# Patient Record
Sex: Female | Born: 2000 | Race: White | Hispanic: No | Marital: Single | Smoking: Never smoker
Health system: Southern US, Community
[De-identification: ages and names within clinical notes are randomized; demographics above are authoritative.]

## PROBLEM LIST (undated history)

## (undated) DIAGNOSIS — F419 Anxiety disorder, unspecified: Secondary | ICD-10-CM

## (undated) HISTORY — DX: Anxiety disorder, unspecified: F41.9

---
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
## 2022-06-24 ENCOUNTER — Ambulatory Visit
Admission: EM | Admit: 2022-06-24 | Discharge: 2022-06-24 | Disposition: A | Discharge status: Home / Self Care | Payer: BC Managed Care – PPO | Attending: Physician Assistant | Admitting: Physician Assistant

## 2022-06-24 DIAGNOSIS — J329 Chronic sinusitis, unspecified: Secondary | ICD-10-CM | POA: Diagnosis not present

## 2022-06-24 DIAGNOSIS — J40 Bronchitis, not specified as acute or chronic: Secondary | ICD-10-CM | POA: Diagnosis not present

## 2022-06-24 MED ORDER — AMOXICILLIN-POT CLAVULANATE 875-125 MG PO TABS: 1.0000 | ORAL_TABLET | Freq: Two times a day (BID) | ORAL | 0 refills | Status: DC
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
## 2022-06-24 NOTE — ED Triage Notes (Signed)
Pt states cough,congestion,ear pain and headaches for over a week.  Seen at urgent care last week had a negative Covid test. Was prescribed steroids and an inhaler with no relief.

## 2023-02-03 ENCOUNTER — Ambulatory Visit (INDEPENDENT_AMBULATORY_CARE_PROVIDER_SITE_OTHER): Payer: BC Managed Care – PPO

## 2023-02-03 ENCOUNTER — Ambulatory Visit
Admission: EM | Admit: 2023-02-03 | Discharge: 2023-02-03 | Disposition: A | Discharge status: Home / Self Care | Payer: BC Managed Care – PPO | Attending: Emergency Medicine | Admitting: Emergency Medicine

## 2023-02-03 ENCOUNTER — Encounter: Payer: Self-pay | Admitting: Emergency Medicine

## 2023-02-03 DIAGNOSIS — R058 Other specified cough: Secondary | ICD-10-CM

## 2023-02-03 DIAGNOSIS — J22 Unspecified acute lower respiratory infection: Secondary | ICD-10-CM | POA: Diagnosis not present

## 2023-02-03 DIAGNOSIS — R509 Fever, unspecified: Secondary | ICD-10-CM | POA: Diagnosis not present

## 2023-02-03 LAB — POCT INFLUENZA A/B
Influenza A, POC: NEGATIVE
Influenza B, POC: NEGATIVE

## 2023-02-03 MED ORDER — AZITHROMYCIN 250 MG PO TABS: ORAL_TABLET | ORAL | 0 refills | Status: AC

## 2023-02-03 MED ORDER — AMOXICILLIN-POT CLAVULANATE 875-125 MG PO TABS: 1.0000 | ORAL_TABLET | Freq: Two times a day (BID) | ORAL | 0 refills | Status: AC

## 2023-02-03 MED ORDER — IBUPROFEN 400 MG PO TABS: 400.0000 mg | ORAL_TABLET | Freq: Three times a day (TID) | ORAL | 0 refills | Status: DC | PRN

## 2023-02-03 MED ORDER — PROMETHAZINE-DM 6.25-15 MG/5ML PO SYRP: 5.0000 mL | ORAL_SOLUTION | Freq: Every evening | ORAL | 0 refills | Status: DC | PRN

## 2023-02-03 MED ORDER — GUAIFENESIN 400 MG PO TABS: ORAL_TABLET | ORAL | 0 refills | Status: DC
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
## 2023-02-03 NOTE — Discharge Instructions (Addendum)
Your rapid influenza antigen test today was negative.  No further influenza testing is indicated.  Your chest x-ray is concerning for possible infection in your right lung.  I recommend that you begin antibiotics for treatment.  Please read below to learn more about the medications, dosages and frequencies that I recommend to help alleviate your symptoms and to get you feeling better soon:   Augmentin (amoxicillin - clavulanic acid):  Please take one (1) dose twice daily for 5 days.  This antibiotic can cause upset stomach, this will resolve once antibiotics are complete.  You are welcome to take a probiotic, eat yogurt, take Imodium while taking this medication.  Please avoid other systemic medications such as Maalox, Pepto-Bismol or milk of magnesia as they can interfere with the body's ability to absorb the antibiotics.    Z-Pak (azithromycin):  Please take two (2) tablets on day one and one tablet daily thereafter until the prescription is complete.This antibiotic can cause upset stomach, this will resolve once antibiotics are complete.  You are welcome to take a probiotic, eat yogurt, take Imodium while taking this medication.  Please avoid other systemic medications such as Maalox, Pepto-Bismol or milk of magnesia as they can interfere with the body's ability to absorb the antibiotics.       Advil, Motrin (ibuprofen): This is a good anti-inflammatory medication which addresses aches, pains and inflammation of the upper airways that causes sinus and nasal congestion as well as in the lower airways which makes your cough feel tight and sometimes burn.  I recommend that you take between 400 to 600 mg every 6-8 hours as needed.  Please do not take more than 2400 mg of ibuprofen in a 24-hour period and please do not take high doses of ibuprofen for more than 3 days in a row as this can lead to stomach ulcers.   Robitussin, Mucinex (guaifenesin): This is an expectorant.  This single symptom reliever  helps break up chest congestion and loosen up thick nasal drainage making phlegm and drainage easier to cough up and to blow out from your nose.  I recommend taking 400 mg in either liquid or tablet form three times daily as needed.  I do not recommend the 12-hour extended relief version or doses higher than 400 mg per each dose as these often make some patients feel jittery or jumpy and can interfere with sleep.  I also do not recommend that you purchase guaifenesin with the ingredient " DM" which is dextromethorphan, a cough suppressant and that you plan on taking it at bedtime.  Guaifenesin 400 mg is a safe dose for people who are being treated for high blood pressure.  This medication is available over-the-counter.   Promethazine DM: Promethazine is both a nasal decongestant that dries up mucous membranes and an antinausea medication.  Promethazine often makes most patients feel fairly sleepy.  "DM" is dextromethorphan, a single symptom reliever which is a cough suppressant found in many over-the-counter cough medications and combination cold preparations.  Please take 5 mL before bedtime to minimize your cough which will help you sleep better.  I have sent a prescription for this medication to your pharmacy because it cannot be purchased over-the-counter.   If symptoms have not meaningfully improved in the next 5 to 7 days, please return for repeat evaluation or follow-up with your regular provider.  If symptoms have worsened in the next 3 to 5 days, please return for repeat evaluation or follow-up with your regular provider.  Thank you for visiting urgent care today.  We appreciate the opportunity to participate in your care.

## 2023-02-03 NOTE — ED Triage Notes (Signed)
Pt reports chills, body aches, a cough and nasal congestion x 1 week. Has been taking Dayquil with no relief.

## 2024-07-30 ENCOUNTER — Ambulatory Visit
Admission: EM | Admit: 2024-07-30 | Discharge: 2024-07-30 | Disposition: A | Discharge status: Home / Self Care | Payer: Self-pay

## 2024-07-30 DIAGNOSIS — J020 Streptococcal pharyngitis: Secondary | ICD-10-CM

## 2024-07-30 LAB — POCT RAPID STREP A (OFFICE): Rapid Strep A Screen: POSITIVE — AB

## 2024-07-30 MED ORDER — AMOXICILLIN 500 MG PO CAPS: 500.0000 mg | ORAL_CAPSULE | Freq: Two times a day (BID) | ORAL | 0 refills | Status: AC

## 2024-07-30 NOTE — ED Provider Notes (Signed)
 [facility]  Note:  This document was prepared using Conservation officer, historic buildings and may include unintentional dictation errors.  MRN: [id number] DOB: 22-Nov-2000  Subjective:   Nathan Stallworth is a 23 y.o. female presenting for severe sore throat, body aches, fever, increased pain with swallowing.  Patient has been taking ibuprofen  dual action with Tylenol  and Mucinex  with minimal improvement to symptoms.  Patient denies any known sick contacts.  No exposure to COVID, flu, strep.  No shortness of breath, chest pain, weakness, dizziness.  No current facility-administered medications for this encounter.  Current Outpatient Medications:    amoxicillin  (AMOXIL ) 500 MG capsule, Take 1 capsule (500 mg total) by mouth 2 (two) times daily for 10 days., Disp: 20 capsule, Rfl: 0   busPIRone  (BUSPAR ) 7.5 MG tablet, Take 7.5 mg by mouth daily at 2 PM., Disp: , Rfl:    cloNIDine  (CATAPRES ) 0.1 MG tablet, Take 0.1 mg by mouth at bedtime., Disp: , Rfl:    FLUOXETINE HCL PO, Take by mouth., Disp: , Rfl:    guaifenesin  (HUMIBID E) 400 MG TABS tablet, Take 1 tablet 3 times daily as needed for chest congestion and cough, Disp: 30 tablet, Rfl: 0   hydrOXYzine (ATARAX) 10 MG tablet, Take 10-20 mg by mouth daily as needed., Disp: , Rfl:    Ibuprofen -Acetaminophen  (ADVIL  DUAL ACTION PO), Take by mouth., Disp: , Rfl:    methylphenidate (METADATE CD) 40 MG CR capsule, Take 40 mg by mouth every morning., Disp: , Rfl:    polyethylene glycol powder (GLYCOLAX /MIRALAX ) 17 GM/SCOOP powder, Take 17 g by mouth daily., Disp: , Rfl:    cholecalciferol  (VITAMIN D ) 25 MCG tablet, Take 2 tablets (2,000 Units total) by mouth daily., Disp: , Rfl:    fluvoxaMINE  (LUVOX ) 50 MG tablet, Take 50 mg by mouth at bedtime., Disp: , Rfl:    ibuprofen  (ADVIL ) 400 MG tablet, Take 1 tablet (400 mg total) by mouth every 8 (eight) hours as needed for up to 30 doses., Disp: 30 tablet, Rfl: 0   promethazine -dextromethorphan  (PROMETHAZINE -DM) 6.25-15 MG/5ML syrup, Take 5 mLs by mouth at bedtime as needed for cough., Disp: 60 mL, Rfl: 0   SRONYX 0.1-20 MG-MCG tablet, Take 1 tablet by mouth daily., Disp: , Rfl:    No Known Allergies  History reviewed. No pertinent past medical history.   Past Surgical History:  Procedure Laterality Date   OPEN REDUCTION INTERNAL FIXATION ACETABULAR FRACTURE STOPPA Left 04/02/2020   Procedure: OPEN REDUCTION INTERNAL FIXATION LEFT ACETABULAR FRACTURE STOPPA  APPROACH;  Surgeon: Kendal Franky SQUIBB, MD;  Location: [facility] OR;  Service: Orthopedics;  Laterality: Left;    History reviewed. No pertinent family history.  Social History   Tobacco Use   Smoking status: Never   Smokeless tobacco: Never  Vaping Use   Vaping status: Never Used  Substance Use Topics   Alcohol use: Yes   Drug use: Never    ROS Refer to HPI for ROS details.  Objective:   Vitals: BP 122/85 (BP Location: Left Arm)   Pulse (!) 113   Temp 100.2 F (37.9 C) (Oral)   Resp 18   Ht 5' 2 (1.575 m)   Wt 230 lb (104.3 kg)   LMP 07/08/2024 (Approximate)   SpO2 98%   BMI 42.07 kg/m   Physical Exam Vitals and nursing note reviewed.  Constitutional:      General: She is not in acute distress.    Appearance: She is well-developed. She is not ill-appearing or  toxic-appearing.  HENT:     Head: Normocephalic and atraumatic.     Nose: Congestion present. No rhinorrhea.     Mouth/Throat:     Mouth: Mucous membranes are moist.     Pharynx: Pharyngeal swelling, oropharyngeal exudate and posterior oropharyngeal erythema present. No uvula swelling.     Tonsils: 1+ on the right. 3+ on the left.  Cardiovascular:     Rate and Rhythm: Normal rate.  Pulmonary:     Effort: Pulmonary effort is normal. No respiratory distress.  Musculoskeletal:        General: Normal range of motion.  Skin:    General: Skin is warm and dry.  Neurological:     General: No focal deficit present.     Mental Status: She is alert  and oriented to person, place, and time.  Psychiatric:        Mood and Affect: Mood normal.        Behavior: Behavior normal.     Procedures  Results for orders placed or performed during the hospital encounter of 07/30/24 (from the past 24 hours)  POCT rapid strep A     Status: Abnormal   Collection Time: 07/30/24  7:51 PM  Result Value Ref Range   Rapid Strep A Screen Positive (A) Negative    No results found.   Assessment and Plan :     Discharge Instructions       1. Strep pharyngitis (Primary) - Ibuprofen -Acetaminophen  (ADVIL  DUAL ACTION PO); Take by mouth.  600 mg of ibuprofen  every 8 hours as needed for pain and inflammation to the throat secondary to strep pharyngitis. - POCT rapid strep A complete and UC is positive for strep pharyngitis - amoxicillin  (AMOXIL ) 500 MG capsule; Take 1 capsule (500 mg total) by mouth 2 (two) times daily for 10 days.  Dispense: 20 capsule; Refill: 0 -Continue to monitor symptoms for any change in severity if there is any escalation of current symptoms or development of new symptoms follow-up in ER for further evaluation and management.       Kihanna Kamiya B Claudia Alvizo   Laquana Villari, [place] B, NP 07/30/24 2010

## 2024-07-30 NOTE — Discharge Instructions (Addendum)
  1. Strep pharyngitis (Primary) - Ibuprofen -Acetaminophen  (ADVIL  DUAL ACTION PO); Take by mouth.  600 mg of ibuprofen  every 8 hours as needed for pain and inflammation to the throat secondary to strep pharyngitis. - POCT rapid strep A complete and UC is positive for strep pharyngitis - amoxicillin  (AMOXIL ) 500 MG capsule; Take 1 capsule (500 mg total) by mouth 2 (two) times daily for 10 days.  Dispense: 20 capsule; Refill: 0 -Continue to monitor symptoms for any change in severity if there is any escalation of current symptoms or development of new symptoms follow-up in ER for further evaluation and management.

## 2024-07-30 NOTE — ED Triage Notes (Signed)
 Patient reports onset of sore throat starting Monday (8 days ago), followed by body aches. Symptoms persisted until two days ago, accompanied by chills, cachexia, and possible fever. The sore throat is described as severely painful. Initially, patient took Mucinex , currently taking Ibuprofen  with last dose at approximately 12:30 PM today (two tablets of dual-action).

## 2024-09-25 ENCOUNTER — Emergency Department (HOSPITAL_BASED_OUTPATIENT_CLINIC_OR_DEPARTMENT_OTHER)
Admission: EM | Admit: 2024-09-25 | Discharge: 2024-09-25 | Disposition: A | Discharge status: Home / Self Care | Attending: Emergency Medicine | Admitting: Emergency Medicine

## 2024-09-25 ENCOUNTER — Other Ambulatory Visit: Payer: Self-pay

## 2024-09-25 DIAGNOSIS — O2341 Unspecified infection of urinary tract in pregnancy, first trimester: Secondary | ICD-10-CM | POA: Insufficient documentation

## 2024-09-25 DIAGNOSIS — Z3A00 Weeks of gestation of pregnancy not specified: Secondary | ICD-10-CM | POA: Diagnosis not present

## 2024-09-25 DIAGNOSIS — N390 Urinary tract infection, site not specified: Secondary | ICD-10-CM

## 2024-09-25 DIAGNOSIS — Z3490 Encounter for supervision of normal pregnancy, unspecified, unspecified trimester: Secondary | ICD-10-CM

## 2024-09-25 LAB — URINALYSIS, ROUTINE W REFLEX MICROSCOPIC
Bilirubin Urine: NEGATIVE
Glucose, UA: NEGATIVE mg/dL
Hgb urine dipstick: NEGATIVE
Ketones, ur: NEGATIVE mg/dL
Nitrite: NEGATIVE
Protein, ur: NEGATIVE mg/dL
Specific Gravity, Urine: 1.005 (ref 1.005–1.030)
pH: 6.5 (ref 5.0–8.0)

## 2024-09-25 LAB — HCG, QUANTITATIVE, PREGNANCY: hCG, Beta Chain, Quant, S: 155 m[IU]/mL — ABNORMAL HIGH (ref <5)

## 2024-09-25 MED ORDER — CEPHALEXIN 500 MG PO CAPS: 500.0000 mg | ORAL_CAPSULE | Freq: Four times a day (QID) | ORAL | 0 refills | Status: DC

## 2024-09-25 NOTE — ED Triage Notes (Signed)
 Pt POV reporting dysuria since yesterday, assumed UTI, also took pregnancy test today, positive.

## 2024-09-25 NOTE — ED Provider Notes (Signed)
 [place] EMERGENCY DEPARTMENT AT [facility] Provider Note   CSN: [id number] Arrival date & time: 09/25/24  8084     Patient presents with: Possible Pregnancy and Dysuria   Jasmine Bradley is a 23 y.o. female.  She is here with some dysuria and frequency that started yesterday.  Also had multiple home pregnancy test positive today.  Last menstrual period was 10/30.  No vaginal bleeding or discharge.  No fevers chills.  No abdominal pain.  {Add pertinent medical, surgical, social history, OB history to YEP:67052} The history is provided by the patient.  Possible Pregnancy This is a new problem. The problem has not changed since onset.Pertinent negatives include no chest pain, no abdominal pain, no headaches and no shortness of breath. The symptoms are aggravated by walking. She has tried nothing for the symptoms.  Dysuria Pain quality:  Burning Pain severity:  Mild Onset quality:  Gradual Duration:  2 days Timing:  Intermittent Progression:  Unchanged Recent urinary tract infections: no   Relieved by:  None tried Worsened by:  Nothing Ineffective treatments:  None tried Associated symptoms: no abdominal pain   Risk factors: pregnant and sexually active        Prior to Admission medications   Medication Sig Start Date End Date Taking? Authorizing Provider  busPIRone  (BUSPAR ) 7.5 MG tablet Take 7.5 mg by mouth daily at 2 PM. 10/04/17   [provider]  cholecalciferol  (VITAMIN D ) 25 MCG tablet Take 2 tablets (2,000 Units total) by mouth daily. 04/07/20   Tammy Sor, PA-C  cloNIDine  (CATAPRES ) 0.1 MG tablet Take 0.1 mg by mouth at bedtime. 02/17/20   [provider]  FLUOXETINE HCL PO Take by mouth.    [provider]  fluvoxaMINE  (LUVOX ) 50 MG tablet Take 50 mg by mouth at bedtime. 02/17/20   [provider]  guaifenesin  (HUMIBID E) 400 MG TABS tablet Take 1 tablet 3 times daily as needed for chest congestion and cough 02/03/23    Joesph Shaver Scales, PA-C  hydrOXYzine (ATARAX) 10 MG tablet Take 10-20 mg by mouth daily as needed. 06/11/24   [provider]  ibuprofen  (ADVIL ) 400 MG tablet Take 1 tablet (400 mg total) by mouth every 8 (eight) hours as needed for up to 30 doses. 02/03/23   Joesph Shaver Scales, PA-C  Ibuprofen -Acetaminophen  (ADVIL  DUAL ACTION PO) Take by mouth.    [provider]  methylphenidate (METADATE CD) 40 MG CR capsule Take 40 mg by mouth every morning. 11/01/17   [provider]  polyethylene glycol powder (GLYCOLAX /MIRALAX ) 17 GM/SCOOP powder Take 17 g by mouth daily. 11/22/17   [provider]  promethazine -dextromethorphan (PROMETHAZINE -DM) 6.25-15 MG/5ML syrup Take 5 mLs by mouth at bedtime as needed for cough. 02/03/23   Joesph Shaver Scales, PA-C  SRONYX 0.1-20 MG-MCG tablet Take 1 tablet by mouth daily. 02/16/20   [provider]    Allergies: Patient has no known allergies.    Review of Systems  Respiratory:  Negative for shortness of breath.   Cardiovascular:  Negative for chest pain.  Gastrointestinal:  Negative for abdominal pain.  Genitourinary:  Positive for dysuria.  Neurological:  Negative for headaches.    Updated Vital Signs BP (!) 146/92   Pulse 96   Temp 99 F (37.2 C) (Oral)   Resp 17   Ht 5' 2 (1.575 m)   Wt 103.9 kg   LMP 08/24/2024 (Approximate)   SpO2 100%   BMI 41.88 kg/m   Physical Exam Vitals  and nursing note reviewed.  Constitutional:      General: She is not in acute distress.    Appearance: Normal appearance. She is well-developed.  HENT:     Head: Normocephalic and atraumatic.  Eyes:     Conjunctiva/sclera: Conjunctivae normal.  Cardiovascular:     Rate and Rhythm: Normal rate and regular rhythm.     Heart sounds: No murmur heard. Pulmonary:     Effort: Pulmonary effort is normal.     Breath sounds: Normal breath sounds.  Abdominal:     Palpations: Abdomen is soft.     Tenderness: There is no  abdominal tenderness. There is no guarding or rebound.  Musculoskeletal:        General: No deformity.     Cervical back: Neck supple.  Skin:    General: Skin is warm and dry.  Neurological:     General: No focal deficit present.     Mental Status: She is alert.     GCS: GCS eye subscore is 4. GCS verbal subscore is 5. GCS motor subscore is 6.     (all labs ordered are listed, but only abnormal results are displayed) Labs Reviewed  URINALYSIS, ROUTINE W REFLEX MICROSCOPIC - Abnormal; Notable for the following components:      Result Value   Color, Urine COLORLESS (*)    Leukocytes,Ua TRACE (*)    Bacteria, UA RARE (*)    All other components within normal limits  HCG, QUANTITATIVE, PREGNANCY - Abnormal; Notable for the following components:   hCG, Beta Chain, Quant, S 155 (*)    All other components within normal limits    EKG: None  Radiology: No results found.  {Document cardiac monitor, telemetry assessment procedure when appropriate:32947} Procedures   Medications Ordered in the ED - No data to display    {Click here for ABCD2, HEART and other calculators REFRESH Note before signing:1}                              Medical Decision Making Amount and/or Complexity of Data Reviewed Labs: ordered.   This patient complains of ***; this involves an extensive number of treatment Options and is a complaint that carries with it a high risk of complications and morbidity. The differential includes ***  I ordered, reviewed and interpreted labs, which included *** I ordered medication *** and reviewed PMP when indicated. I ordered imaging studies which included *** and I independently    visualized and interpreted imaging which showed *** Additional history obtained from *** Previous records obtained and reviewed *** I consulted *** and discussed lab and imaging findings and discussed disposition.  Cardiac monitoring reviewed, *** Social determinants considered,  *** Critical Interventions: ***  After the interventions stated above, I reevaluated the patient and found *** Admission and further testing considered, ***   {Document critical care time when appropriate  Document review of labs and clinical decision tools ie CHADS2VASC2, etc  Document your independent review of radiology images and any outside records  Document your discussion with family members, caretakers and with consultants  Document social determinants of health affecting pt's care  Document your decision making why or why not admission, treatments were needed:32947:::1}   Final diagnoses:  None    ED Discharge Orders     None

## 2024-09-25 NOTE — ED Notes (Signed)
 Light green, red, and lavender sent to lab in triage

## 2024-09-25 NOTE — Discharge Instructions (Addendum)
 Your pregnancy quant was 155.  Your urine was showing some possible signs of infection and you are having dysuria.  We are starting you on antibiotics.  Please contact your OB doctor or the OB clinic for close follow-up.  Return if any worsening or concerning symptoms.

## 2024-09-25 NOTE — ED Notes (Signed)
Patient verbalizes understanding of discharge instructions. Opportunity for questioning and answers were provided. Armband removed by staff, pt discharged from ED. Ambulated out to lobby with husband

## 2024-09-27 LAB — URINE CULTURE

## 2024-09-30 ENCOUNTER — Other Ambulatory Visit: Payer: Self-pay

## 2024-09-30 ENCOUNTER — Ambulatory Visit: Payer: Self-pay

## 2024-09-30 VITALS — Wt 232.0 lb

## 2024-09-30 DIAGNOSIS — Z3201 Encounter for pregnancy test, result positive: Secondary | ICD-10-CM | POA: Diagnosis not present

## 2024-09-30 DIAGNOSIS — Z3200 Encounter for pregnancy test, result unknown: Secondary | ICD-10-CM

## 2024-09-30 LAB — POCT PREGNANCY, URINE: Preg Test, Ur: POSITIVE — AB

## 2024-09-30 NOTE — Progress Notes (Cosign Needed Addendum)
 Possible Pregnancy  Here today for pregnancy confirmation. UPT in office today is positive. Pt reports first positive home UPT on 09/24/24. Reviewed dating with patient:   LMP: 08/24/2024 EDD: 05/31/2025 5w 2d today  OB history reviewed. Reviewed medications and allergies with patient; list of medications safe to take during pregnancy given.  Recommended pt begin prenatal vitamin and schedule prenatal care.  Rosina RN

## 2024-09-30 NOTE — Patient Instructions (Signed)

## 2024-10-14 ENCOUNTER — Other Ambulatory Visit

## 2024-10-14 ENCOUNTER — Other Ambulatory Visit: Payer: Self-pay

## 2024-10-14 ENCOUNTER — Other Ambulatory Visit: Payer: Self-pay | Admitting: Obstetrics and Gynecology

## 2024-10-14 DIAGNOSIS — O3680X0 Pregnancy with inconclusive fetal viability, not applicable or unspecified: Secondary | ICD-10-CM

## 2024-10-14 DIAGNOSIS — Z3A01 Less than 8 weeks gestation of pregnancy: Secondary | ICD-10-CM

## 2024-10-14 DIAGNOSIS — Z3491 Encounter for supervision of normal pregnancy, unspecified, first trimester: Secondary | ICD-10-CM | POA: Diagnosis not present

## 2024-11-04 ENCOUNTER — Encounter: Payer: Self-pay | Admitting: *Deleted

## 2024-11-04 ENCOUNTER — Telehealth: Payer: Self-pay | Admitting: *Deleted

## 2024-11-04 DIAGNOSIS — O99210 Obesity complicating pregnancy, unspecified trimester: Secondary | ICD-10-CM | POA: Insufficient documentation

## 2024-11-04 DIAGNOSIS — Z6841 Body Mass Index (BMI) 40.0 and over, adult: Secondary | ICD-10-CM | POA: Insufficient documentation

## 2024-11-04 DIAGNOSIS — E669 Obesity, unspecified: Secondary | ICD-10-CM

## 2024-11-04 DIAGNOSIS — O99211 Obesity complicating pregnancy, first trimester: Secondary | ICD-10-CM

## 2024-11-04 DIAGNOSIS — Z3A10 10 weeks gestation of pregnancy: Secondary | ICD-10-CM

## 2024-11-04 DIAGNOSIS — Z3490 Encounter for supervision of normal pregnancy, unspecified, unspecified trimester: Secondary | ICD-10-CM | POA: Insufficient documentation

## 2024-11-04 MED ORDER — GOJJI WEIGHT SCALE MISC: 1.0000 | 0 refills | Status: AC
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
## 2024-11-11 ENCOUNTER — Encounter: Payer: Self-pay | Admitting: Family Medicine

## 2024-11-11 ENCOUNTER — Other Ambulatory Visit (HOSPITAL_COMMUNITY)
Admission: RE | Admit: 2024-11-11 | Discharge: 2024-11-11 | Disposition: A | Discharge status: Home / Self Care | Payer: Self-pay | Source: Ambulatory Visit | Attending: Family Medicine | Admitting: Family Medicine

## 2024-11-11 ENCOUNTER — Other Ambulatory Visit: Payer: Self-pay

## 2024-11-11 ENCOUNTER — Ambulatory Visit (INDEPENDENT_AMBULATORY_CARE_PROVIDER_SITE_OTHER): Payer: Self-pay | Admitting: Family Medicine

## 2024-11-11 VITALS — BP 152/81 | HR 91 | Wt 231.0 lb

## 2024-11-11 DIAGNOSIS — F411 Generalized anxiety disorder: Secondary | ICD-10-CM | POA: Insufficient documentation

## 2024-11-11 DIAGNOSIS — Z6841 Body Mass Index (BMI) 40.0 and over, adult: Secondary | ICD-10-CM

## 2024-11-11 DIAGNOSIS — Z3491 Encounter for supervision of normal pregnancy, unspecified, first trimester: Secondary | ICD-10-CM | POA: Insufficient documentation

## 2024-11-11 DIAGNOSIS — Z3A11 11 weeks gestation of pregnancy: Secondary | ICD-10-CM

## 2024-11-11 DIAGNOSIS — Z8781 Personal history of (healed) traumatic fracture: Secondary | ICD-10-CM

## 2024-11-11 DIAGNOSIS — O219 Vomiting of pregnancy, unspecified: Secondary | ICD-10-CM

## 2024-11-11 MED ORDER — SCOPOLAMINE 1 MG/3DAYS TD PT72: 1.0000 | MEDICATED_PATCH | TRANSDERMAL | 3 refills | Status: AC

## 2024-11-11 MED ORDER — ONDANSETRON 4 MG PO TBDP: 4.0000 mg | ORAL_TABLET | Freq: Three times a day (TID) | ORAL | 2 refills | Status: AC | PRN

## 2024-11-11 MED ORDER — ASPIRIN 81 MG PO CHEW: 81.0000 mg | CHEWABLE_TABLET | Freq: Every day | ORAL | 3 refills | Status: AC
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
## 2024-11-12 ENCOUNTER — Ambulatory Visit: Payer: Self-pay | Admitting: Family Medicine

## 2024-11-12 DIAGNOSIS — Z3491 Encounter for supervision of normal pregnancy, unspecified, first trimester: Secondary | ICD-10-CM

## 2024-11-12 LAB — CBC/D/PLT+RPR+RH+ABO+RUBIGG...
Antibody Screen: NEGATIVE
Basophils Absolute: 0.1 x10E3/uL (ref 0.0–0.2)
Basos: 1 %
EOS (ABSOLUTE): 0.2 x10E3/uL (ref 0.0–0.4)
Eos: 2 %
HCV Ab: NONREACTIVE
HIV Screen 4th Generation wRfx: NONREACTIVE
Hematocrit: 39.8 % (ref 34.0–46.6)
Hemoglobin: 13.4 g/dL (ref 11.1–15.9)
Hepatitis B Surface Ag: NEGATIVE
Immature Grans (Abs): 0 x10E3/uL (ref 0.0–0.1)
Immature Granulocytes: 0 %
Lymphocytes Absolute: 1.5 x10E3/uL (ref 0.7–3.1)
Lymphs: 16 %
MCH: 29.5 pg (ref 26.6–33.0)
MCHC: 33.7 g/dL (ref 31.5–35.7)
MCV: 88 fL (ref 79–97)
Monocytes Absolute: 0.6 x10E3/uL (ref 0.1–0.9)
Monocytes: 7 %
Neutrophils Absolute: 7.2 x10E3/uL — ABNORMAL HIGH (ref 1.4–7.0)
Neutrophils: 74 %
Platelets: 333 x10E3/uL (ref 150–450)
RBC: 4.54 x10E6/uL (ref 3.77–5.28)
RDW: 12.9 % (ref 11.7–15.4)
RPR Ser Ql: NONREACTIVE
Rh Factor: POSITIVE
Rubella Antibodies, IGG: 3.08 {index}
WBC: 9.5 x10E3/uL (ref 3.4–10.8)

## 2024-11-12 LAB — GC/CHLAMYDIA PROBE AMP (~~LOC~~) NOT AT ARMC
Chlamydia: NEGATIVE
Comment: NEGATIVE
Comment: NORMAL
Neisseria Gonorrhea: NEGATIVE

## 2024-11-12 LAB — HCV INTERPRETATION

## 2024-11-12 LAB — HEMOGLOBIN A1C
Est. average glucose Bld gHb Est-mCnc: 108 mg/dL
Hgb A1c MFr Bld: 5.4 % (ref 4.8–5.6)

## 2024-11-13 LAB — URINE CULTURE, OB REFLEX

## 2024-11-13 LAB — CULTURE, OB URINE

## 2024-11-16 LAB — PANORAMA PRENATAL TEST FULL PANEL:PANORAMA TEST PLUS 5 ADDITIONAL MICRODELETIONS: FETAL FRACTION: 7.5

## 2024-11-21 LAB — HORIZON CUSTOM: REPORT SUMMARY: NEGATIVE

## 2024-12-09 ENCOUNTER — Encounter: Payer: Self-pay | Admitting: Family Medicine

## 2025-01-06 ENCOUNTER — Ambulatory Visit: Payer: Self-pay
